# Patient Record
Sex: Female | Born: 1935 | Race: White | Hispanic: No | Marital: Single
Health system: Southern US, Community
[De-identification: ages and names within clinical notes are randomized; demographics above are authoritative.]

## PROBLEM LIST (undated history)

## (undated) DIAGNOSIS — I639 Cerebral infarction, unspecified: Secondary | ICD-10-CM

## (undated) DIAGNOSIS — F0390 Unspecified dementia without behavioral disturbance: Secondary | ICD-10-CM

---

## 2018-06-09 MED FILL — busPIRone HCL 10 MG TABS: 10 | 90 days supply | Qty: 90 | Fill #0

## 2018-06-09 MED FILL — AMLODIPINE 2.5 MG TABLET: 2.5 | 90 days supply | Qty: 90 | Fill #0

## 2018-09-17 ENCOUNTER — Emergency Department (HOSPITAL_COMMUNITY): Payer: Medicare Other

## 2018-09-17 ENCOUNTER — Encounter (HOSPITAL_COMMUNITY): Payer: Self-pay | Admitting: *Deleted

## 2018-09-17 ENCOUNTER — Other Ambulatory Visit: Payer: Self-pay

## 2018-09-17 ENCOUNTER — Emergency Department (HOSPITAL_COMMUNITY)
Admission: EM | Admit: 2018-09-17 | Discharge: 2018-09-17 | Disposition: A | Discharge status: Home / Self Care | Payer: Medicare Other | Attending: Emergency Medicine | Admitting: Emergency Medicine

## 2018-09-17 DIAGNOSIS — R112 Nausea with vomiting, unspecified: Secondary | ICD-10-CM | POA: Diagnosis not present

## 2018-09-17 DIAGNOSIS — F0390 Unspecified dementia without behavioral disturbance: Secondary | ICD-10-CM | POA: Diagnosis not present

## 2018-09-17 DIAGNOSIS — R55 Syncope and collapse: Secondary | ICD-10-CM | POA: Insufficient documentation

## 2018-09-17 HISTORY — DX: Unspecified dementia, unspecified severity, without behavioral disturbance, psychotic disturbance, mood disturbance, and anxiety: F03.90

## 2018-09-17 HISTORY — DX: Cerebral infarction, unspecified: I63.9

## 2018-09-17 LAB — CBG MONITORING, ED: Glucose-Capillary: 107 mg/dL — ABNORMAL HIGH (ref 70–99)

## 2018-09-17 LAB — BASIC METABOLIC PANEL
Anion gap: 5 (ref 5–15)
BUN: 25 mg/dL — ABNORMAL HIGH (ref 8–23)
CO2: 21 mmol/L — ABNORMAL LOW (ref 22–32)
Calcium: 8.2 mg/dL — ABNORMAL LOW (ref 8.9–10.3)
Chloride: 112 mmol/L — ABNORMAL HIGH (ref 98–111)
Creatinine, Ser: 0.96 mg/dL (ref 0.44–1.00)
GFR calc Af Amer: >60 mL/min (ref >60)
GFR calc non Af Amer: 55 mL/min — ABNORMAL LOW (ref >60)
Glucose, Bld: 113 mg/dL — ABNORMAL HIGH (ref 70–99)
Potassium: 5.2 mmol/L — ABNORMAL HIGH (ref 3.5–5.1)
Sodium: 138 mmol/L (ref 135–145)

## 2018-09-17 LAB — CBC
HCT: 39.9 % (ref 36.0–46.0)
Hemoglobin: 12.6 g/dL (ref 12.0–15.0)
MCH: 30.5 pg (ref 26.0–34.0)
MCHC: 31.6 g/dL (ref 30.0–36.0)
MCV: 96.6 fL (ref 80.0–100.0)
Platelets: UNDETERMINED 10*3/uL (ref 150–400)
RBC: 4.13 MIL/uL (ref 3.87–5.11)
RDW: 13.5 % (ref 11.5–15.5)
WBC: 3.8 10*3/uL — ABNORMAL LOW (ref 4.0–10.5)
nRBC: 0 % (ref 0.0–0.2)

## 2018-09-17 MED ORDER — SODIUM CHLORIDE 0.9% FLUSH
3.0000 mL | Freq: Once | INTRAVENOUS | Status: AC
  Administered 2018-09-17: 3 mL via INTRAVENOUS

## 2018-09-17 NOTE — Discharge Instructions (Addendum)
Be sure to follow-up with her cardiology colleagues tomorrow for appropriate ongoing care.  Please discuss Holter monitoring and cardiology follow-up.  Return here for concerning changes in your condition.

## 2018-09-17 NOTE — ED Provider Notes (Signed)
[facility] Provider Note   CSN: [id number] Arrival date & time: 09/17/18  1926     History   Chief Complaint Chief Complaint  Patient presents with  . Loss of Consciousness    HPI Jacqulyn Duckingeggy Morissette is a 85 y.o. female.     HPI Patient presents after episode of syncope. Patient has some dementia, but provides details reasonably well.  When she is accompanied by her daughter who corroborates and elaborates on the story. Daughter notes that the patient typically has episodes of near syncope and/or syncope each day after breakfast, recently. However, today, after dinner patient had an episode of unresponsiveness, nausea, vomiting x1 afterwards.  No fall, no trauma, and currently the patient has no complaints per No recent medication change, diet change, activity change. Patient has no history of known cardiac disease, has had a stroke in the past.  Past Medical History:  Diagnosis Date  . Dementia (HCC)   . Stroke [facility](HCC)      Home Medications    Prior to Admission medications   Not on File    Family History No family history on file.  Social History No cigarettes  Allergies   Patient has no known allergies.   Review of Systems Review of Systems  Constitutional:       Per HPI, otherwise negative  HENT:       Per HPI, otherwise negative  Respiratory:       Per HPI, otherwise negative  Cardiovascular:       Per HPI, otherwise negative  Gastrointestinal: Negative for vomiting.  Endocrine:       Negative aside from HPI  Genitourinary:       Neg aside from HPI   Musculoskeletal:       Per HPI, otherwise negative  Skin: Negative.   Neurological: Positive for syncope.  Psychiatric/Behavioral: Positive for confusion.     Physical Exam Updated Vital Signs BP 137/78   Pulse 82   Resp (!) 22   SpO2 99%   Physical Exam Vitals signs and nursing note reviewed.  Constitutional:      General: She is not in acute distress.    Appearance: She is well-developed.  HENT:     Head: Normocephalic and atraumatic.  Eyes:     Conjunctiva/sclera: Conjunctivae normal.  Cardiovascular:     Rate and Rhythm: Normal rate and regular rhythm.  Pulmonary:     Effort: Pulmonary effort is normal. No respiratory distress.     Breath sounds: Normal breath sounds. No stridor.  Abdominal:     General: There is no distension.  Skin:    General: Skin is warm and dry.  Neurological:     Mental Status: She is alert and oriented to person, place, and time.     Cranial Nerves: No cranial nerve deficit.  Psychiatric:        Mood and Affect: Mood normal.      ED Treatments / Results  Labs (all labs ordered are listed, but only abnormal results are displayed) Labs Reviewed  BASIC METABOLIC PANEL - Abnormal; Notable for the following components:      Result Value   Potassium 5.2 (*)    Chloride 112 (*)    CO2 21 (*)    Glucose, Bld 113 (*)    BUN 25 (*)    Calcium 8.2 (*)    GFR calc non Af Amer 55 (*)    All other components within normal limits  CBC -  Abnormal; Notable for the following components:   WBC 3.8 (*)    All other components within normal limits  CBG MONITORING, ED - Abnormal; Notable for the following components:   Glucose-Capillary 107 (*)    All other components within normal limits  CBG MONITORING, ED    EKG EKG Interpretation  Date/Time:  Wednesday September 17 2018 19:31:59 EDT Ventricular Rate:  75 PR Interval:    QRS Duration: 88 QT Interval:  391 QTC Calculation: 437 R Axis:   43 Text Interpretation:  Sinus rhythm ST-t wave abnormality Artifact Abnormal ECG Confirmed by Carmin Muskrat [phone]) on 09/17/2018 7:40:50 PM   Radiology Ct Head Wo Contrast  Result Date: 09/17/2018 CLINICAL DATA:  Recent syncopal episode EXAM: CT HEAD WITHOUT CONTRAST TECHNIQUE: Contiguous axial images were obtained from the base of the skull through the vertex without intravenous contrast. COMPARISON:  None. FINDINGS:  Brain: Mild atrophic and chronic white matter ischemic changes are seen. No findings to suggest acute hemorrhage, acute infarction or space-occupying mass lesion are noted. Vascular: No hyperdense vessel or unexpected calcification. Skull: Normal. Negative for fracture or focal lesion. Sinuses/Orbits: No acute finding. Other: None. IMPRESSION: Chronic changes without acute abnormality. Electronically Signed   By: Inez Catalina M.D.   On: 09/17/2018 21:17   Dg Chest Port 1 View  Result Date: 09/17/2018 CLINICAL DATA:  85 y/o  F; nausea and vomiting.  Syncope. EXAM: PORTABLE CHEST 1 VIEW COMPARISON:  None. FINDINGS: Normal cardiac silhouette. Aortic atherosclerosis with calcification. Clear lungs. No pleural effusion or pneumothorax. No acute osseous abnormality is evident. Surgical clips project over the right lower neck. IMPRESSION: No active disease. Electronically Signed   By: Kristine Garbe M.D.   On: 09/17/2018 20:18    Procedures Procedures (including critical care time)  Medications Ordered in ED Medications  sodium chloride flush (NS) 0.9 % injection 3 mL (3 mLs Intravenous Given 09/17/18 2040)     Initial Impression / Assessment and Plan / ED Course  I have reviewed the triage vital signs and the nursing notes.  Pertinent labs & imaging results that were available during my care of the patient were reviewed by me and considered in my medical decision making (see chart for details).  Patient in no distress on repeat exam.  When she urinated just prior to arrival, and after discussion with her daughter, they feel comfortable leaving prior to repeat evaluation. Patient has no leukocytosis, no fever, no dysuria, low suspicion for occult UTI. She does have mild hyperkalemia, mild elevated creatinine, both consistent with mild dehydration and has received fluid resuscitation. We discussed all findings at length, with no evidence for sustained arrhythmia, no evidence for new stroke, no  evidence for pneumonia, the patient will follow-up closely as an outpatient, both with cardiology for Holter monitoring and with primary care.   Final Clinical Impressions(s) / ED Diagnoses   Final diagnoses:  Syncope and collapse     Carmin Muskrat, MD 09/17/18 2229

## 2018-09-17 NOTE — ED Notes (Signed)
[phone] daughter Olivia Mackie

## 2018-09-17 NOTE — ED Triage Notes (Signed)
Pt was eating dinner  With daughter and while sitting at table Pt passed out per daughter. Pt had nausea and vomited x 1. Iv Lt FA18 g.. Pt has a hx dementia. EMS reported SBP 88 pal. The SBP 114/50

## 2018-09-17 NOTE — ED Notes (Signed)
Pt given ice water.

## 2018-10-01 ENCOUNTER — Telehealth: Payer: Self-pay

## 2018-10-01 NOTE — Telephone Encounter (Signed)
NOTES ON FILE FROM [facility] [phone] REFERRAL TO SCHEDULING

## 2018-10-02 ENCOUNTER — Telehealth: Payer: Self-pay | Admitting: *Deleted

## 2018-10-02 ENCOUNTER — Other Ambulatory Visit: Payer: Self-pay | Admitting: *Deleted

## 2018-10-02 DIAGNOSIS — R42 Dizziness and giddiness: Secondary | ICD-10-CM

## 2018-10-02 NOTE — Telephone Encounter (Signed)
Arrangements made to deliver ZIO XT patch monitor to patients daughter, Delfino Lovett. Traci will pick up ZIO XT monitor on 10/03/18 late afternoon at [address] [employer] at the [place] screening table.

## 2018-10-03 ENCOUNTER — Ambulatory Visit: Payer: Medicare Other

## 2018-10-03 ENCOUNTER — Encounter: Payer: Self-pay | Admitting: *Deleted

## 2018-10-03 ENCOUNTER — Other Ambulatory Visit: Payer: Self-pay | Admitting: *Deleted

## 2018-10-03 ENCOUNTER — Other Ambulatory Visit: Payer: Self-pay

## 2018-10-03 DIAGNOSIS — R42 Dizziness and giddiness: Secondary | ICD-10-CM

## 2018-10-03 NOTE — Progress Notes (Signed)
Patient ID: Bethany Cooper, female   DOB: [birth year], 85 y.o.   MRN: [id number] ZIO XT long term holter monitor will be left with covid screening staff in the lobby of [address]  Daughter, Vito Backers will pick monitor up Friday, 10/03/2018. She has been Clinical biochemist will only be available until 4:30-4:45 pm.  Telephone number for Leana Roe is [phone].

## 2018-10-04 ENCOUNTER — Ambulatory Visit (INDEPENDENT_AMBULATORY_CARE_PROVIDER_SITE_OTHER): Payer: Medicare Other

## 2018-10-04 DIAGNOSIS — R42 Dizziness and giddiness: Secondary | ICD-10-CM

## 2019-01-23 MED FILL — AMLODIPINE 2.5 MG TABLET: 2.5 | 90 days supply | Qty: 90 | Fill #0

## 2019-02-20 MED FILL — MECLIZINE 25 MG TABLET: 25 | 10 days supply | Qty: 30 | Fill #0

## 2019-02-24 MED FILL — MECLIZINE 25 MG TABLET: 25 | 10 days supply | Qty: 30 | Fill #0

## 2019-03-03 MED FILL — QUETIAPINE FUMARATE 50 MG T: 50 | 30 days supply | Qty: 30 | Fill #0

## 2019-04-17 ENCOUNTER — Ambulatory Visit: Payer: Medicare Other

## 2019-04-22 ENCOUNTER — Ambulatory Visit: Payer: Medicare Other

## 2019-04-27 ENCOUNTER — Ambulatory Visit: Payer: Medicare Other | Attending: Internal Medicine

## 2019-05-08 ENCOUNTER — Ambulatory Visit: Payer: Medicare Other

## 2019-05-10 ENCOUNTER — Ambulatory Visit: Payer: Medicare Other | Attending: Internal Medicine

## 2019-05-10 DIAGNOSIS — Z23 Encounter for immunization: Secondary | ICD-10-CM

## 2019-05-10 NOTE — Progress Notes (Signed)
   Covid-19 Vaccination Clinic  Name:  Bethany Cooper    MRN: [id number] DOB: 09-Jan-1933  05/10/2019  Bethany Cooper was observed post Covid-19 immunization for 15 minutes without incidence. She was provided with Vaccine Information Sheet and instruction to access the V-Safe system.   Bethany Cooper was instructed to call 911 with any severe reactions post vaccine: Marland Kitchen Difficulty breathing  . Swelling of your face and throat  . A fast heartbeat  . A bad rash all over your body  . Dizziness and weakness    Immunizations Administered    Name Date Dose VIS Date Route   Pfizer COVID-19 Vaccine 05/10/2019  8:36 AM 0.3 mL 02/27/2019 Intramuscular   Manufacturer: [employer], [employer]   Lot: [id number]   NDC: [address]-6

## 2019-05-11 ENCOUNTER — Ambulatory Visit: Payer: Medicare Other

## 2019-06-03 ENCOUNTER — Ambulatory Visit: Payer: Medicare Other | Attending: Internal Medicine

## 2019-06-03 DIAGNOSIS — Z23 Encounter for immunization: Secondary | ICD-10-CM

## 2019-06-03 NOTE — Progress Notes (Signed)
   Covid-19 Vaccination Clinic  Name:  Aubryana Vittorio    MRN: [id number] DOB: 07/12/1932  06/03/2019  Ms. Aguilar was observed post Covid-19 immunization for 15 minutes without incident. She was provided with Vaccine Information Sheet and instruction to access the V-Safe system.   Ms. Vosler was instructed to call 911 with any severe reactions post vaccine: Marland Kitchen Difficulty breathing  . Swelling of face and throat  . A fast heartbeat  . A bad rash all over body  . Dizziness and weakness   Immunizations Administered    Name Date Dose VIS Date Route   Pfizer COVID-19 Vaccine 06/03/2019  4:21 PM 0.3 mL 02/27/2019 Intramuscular   Manufacturer: [employer], [employer]   Lot: [id number]   NDC: [address]-0

## 2019-08-03 MED FILL — MIRTAZAPINE 15 MG TABLET: 15 | 30 days supply | Qty: 30 | Fill #0

## 2019-12-08 MED FILL — levoFLOXacin 250 MG TABS: 250 | 5 days supply | Qty: 6 | Fill #0

## 2020-07-19 ENCOUNTER — Telehealth: Payer: Self-pay

## 2020-07-19 NOTE — Telephone Encounter (Signed)
I connected by phone with Jacqulyn Ducking and/or patient's caregiver on 07/19/2020 at 9:24 AM to discuss the potential vaccination through our Homebound vaccination initiative.   Prevaccination Checklist for COVID-19 Vaccines  1.  Are you feeling sick today? no   2.  Have you ever received a dose of a COVID-19 vaccine?  yes      If yes, which one? Pfizer   How many dose of Covid-19 vaccine have your received and dates ? 3, 05/10/19, 06/03/19, 01/01/20   Check all that apply: I live in a long-term care setting. no  I have been diagnosed with a medical condition(s). Please list: _______________________ (pertinent to homebound status)  I am a first responder. no  I work in a long-term care facility, correctional facility, hospital, restaurant, retail setting, school, or other setting with high exposure to the public. no  4. Do you have a health condition or are you undergoing treatment that makes you moderately or severely immunocompromised? (This would include treatment for cancer or HIV, receipt of organ transplant, immunosuppressive therapy or high-dose corticosteroids, CAR-T-cell therapy, hematopoietic cell transplant [HCT], DiGeorge syndrome or Wiskott-Aldrich syndrome)  no  5. Have you received hematopoietic cell transplant (HCT) or CAR-T-cell therapies since receiving COVID-19 vaccine? no  6.  Have you ever had an allergic reaction: (This would include a severe reaction [ e.g., anaphylaxis] that required treatment with epinephrine or EpiPen or that caused you to go to the hospital.  It would also include an allergic reaction that occurred within 4 hours that caused hives, swelling, or respiratory distress, including wheezing.) A.  A previous dose of COVID-19 vaccine. no  B.  A vaccine or injectable therapy that contains multiple components, one of which is a COVID-19 vaccine component, but it is not known which component elicited the immediate reaction. no  C.  Are you allergic to polyethylene  glycol? no  D. Are you allergic to Polysorbate, which is found in some vaccines, film coated tablets and intravenous steroids?  no   7.  Have you ever had an allergic reaction to another vaccine (other than COVID-19 vaccine) or an injectable medication? (This would include a severe reaction [ e.g., anaphylaxis] that required treatment with epinephrine or EpiPen or that caused you to go to the hospital.  It would also include an allergic reaction that occurred within 4 hours that caused hives, swelling, or respiratory distress, including wheezing.)  no  8.  Have you ever had a severe allergic reaction (e.g., anaphylaxis) to something other than a component of the COVID-19 vaccine, or any vaccine or injectable medication?  This would include food, pet, venom, environmental, or oral medication allergies.  yes PCN  Check all that apply to you:  Am a female between ages 60 and 21 years old  no  Women 31 through 85 years of age can receive any FDA-authorized or -approved COVID-19 vaccine. However, they should be informed of the rare but increased risk of thrombosis with thrombocytopenia syndrome (TTS) after receipt of the [employer] Vaccine and the availability of other FDA-authorized and -approved COVID-19 vaccines. People who had TTS after a first dose of Janssen vaccine should not receive a subsequent dose of Janssen product    Am a female between ages 27 and 8 years old  no Males 5 through 85 years of age may receive the correct formulation of Pfizer-BioNTech COVID-19 vaccine. Males 18 and older can receive any FDA-authorized or -approved vaccine. However, people receiving an mRNA COVID-19 vaccine, especially males  12 through 85 years of age and their parents/legal representative (when relevant), should be informed of the risk of developing myocarditis (an inflammation of the heart muscle) or pericarditis (inflammation of the lining around the heart) after receipt of an mRNA vaccine. The risk of  developing either myocarditis or pericarditis after vaccination is low, and lower than the risk of myocarditis associated with SARS-CoV-2 infection in adolescents and adults. Vaccine recipients should be counseled about the need to seek care if symptoms of myocarditis or pericarditis develop after vaccination     Have a history of myocarditis or pericarditis  no Myocarditis or pericarditis after receipt of the first dose of an mRNA COVID-19 vaccine series but before administration of the second dose  Experts advise that people who develop myocarditis or pericarditis after a dose of an mRNA COVID-19 vaccine not receive a subsequent dose of any COVID-19 vaccine, until additional safety data are available.  Administration of a subsequent dose of COVID-19 vaccine before safety data are available can be considered in certain circumstances after the episode of myocarditis or pericarditis has completely resolved. Until additional data are available, some experts recommend a [employer] COVID-19 vaccine be considered instead of an mRNA COVID-19 vaccine. Decisions about proceeding with a subsequent dose should include a conversation between the patient, their parent/legal representative (when relevant), and their clinical team, which may include a cardiologist.    Have been treated with monoclonal antibodies or convalescent serum to prevent or treat COVID-19  no Vaccination should be offered to people regardless of history of prior symptomatic or asymptomatic SARS-CoV-2 infection. There is no recommended minimal interval between infection and vaccination.  However, vaccination should be deferred if a patient received monoclonal antibodies or convalescent serum as treatment for COVID-19 or for post-exposure prophylaxis. This is a precautionary measure until additional information becomes available, to avoid interference of the antibody treatment with vaccine-induced immune responses.  Defer COVID-19 vaccination for 30  days when a passive antibody product was used for post-exposure prophylaxis.  Defer COVID-19 vaccination for 90 days when a passive antibody product was used to treat COVID-19.     Diagnosed with Multisystem Inflammatory Syndrome (MIS-C or MIS-A) after a COVID-19 infection  no It is unknown if people with a history of MIS-C or MIS-A are at risk for a dysregulated immune response to COVID-19 vaccination.  People with a history of MIS-C or MIS-A may choose to be vaccinated. Considerations for vaccination may include:   Clinical recovery from MIS-C or MIS-A, including return to normal cardiac function   Personal risk of severe acute COVID-19 (e.g., age, underlying conditions)   High or substantial community transmission of SARS-CoV-2 and personal increased risk of reinfection.   Timing of any immunomodulatory therapies (general best practice guidelines for immunization can be consulted for more information [employer])   It has been 90 days or more since their diagnosis of MIS-C   Onset of MIS-C occurred before any COVID-19 vaccination   A conversation between the patient, their guardian(s), and their clinical team or a specialist may assist with COVID-19 vaccination decisions. Healthcare providers and health departments may also request a consultation from the Clinical Immunization Safety Assessment Project at [employer] vaccinesafety/ensuringsafety/monitoring/cisa/index.html.     Have a bleeding disorder  no Take a blood thinner  no As with all vaccines, any COVID-19 vaccine product may be given to these patients, if a physician familiar with the patient's bleeding risk determines that the vaccine can be administered intramuscularly with reasonable safety.  ACIP recommends  the following technique for intramuscular vaccination in patients with bleeding disorders or taking blood thinners: a fine-gauge needle (23-gauge or smaller caliber)  should be used for the vaccination, followed by firm pressure on the site, without rubbing, for at least 2 minutes.  People who regularly take aspirin or anticoagulants as part of their routine medications do not need to stop these medications prior to receipt of any COVID-19 vaccine.    Have a history of heparin-induced thrombocytopenia (HIT)  no Although the etiology of TTS associated with the [employer] COVID-19 vaccine is unclear, it appears to be similar to another rare immune-mediated syndrome, heparin-induced thrombocytopenia (HIT). People with a history of an episode of an immune-mediated syndrome characterized by thrombosis and thrombocytopenia, such as HIT, should be offered a currently FDA-approved or FDA-authorized mRNA COVID-19 vaccine if it has been ?90 days since their TTS resolved. After 90 days, patients may be vaccinated with any currently FDA-approved or FDA-authorized COVID-19 vaccine, including Janssen COVID-19 Vaccine. However, people who developed TTS after their initial Linwood Dibbles vaccine should not receive a Janssen booster dose.  Experts believe the following factors do not make people more susceptible to TTS after receipt of the [employer]. People with these conditions can be vaccinated with any FDA-authorized or - approved COVID-19 vaccine, including the [employer] COVID-19 Vaccine:   A prior history of venous thromboembolism   Risk factors for venous thromboembolism (e.g., inherited or acquired thrombophilia including Factor V Leiden; prothrombin gene 20210A mutation; antiphospholipid syndrome; protein C, protein S or antithrombin deficiency   A prior history of other types of thromboses not associated with thrombocytopenia   Pregnancy, post-partum status, or receipt of hormonal contraceptives (e.g., combined oral contraceptives, patch, ring)   Additional recipient education materials can be found at [employer] vaccines/safety/JJUpdate.html.     Am currently pregnant or breastfeeding  no Vaccination is recommended for all people aged 74 years and older, including people that are:   Pregnant   Breastfeeding   Trying to get pregnant now or who might become pregnant in the future   Pregnant, breastfeeding, and post-partum people 49 through 85 years of age should be aware of the rare risk of TTS after receipt of the [employer] COVID-19 Vaccine and the availability of other FDA-authorized or -approved COVID-19 vaccines (i.e., mRNA vaccines).    Have received dermal fillers  no FDA-authorized or -approved COVID-19 vaccines can be administered to people who have received injectable dermal fillers who have no contraindications for vaccination.  Infrequently, these people might experience temporary swelling at or near the site of filler injection (usually the face or lips) following administration of a dose of an mRNA COVID-19 vaccine. These people should be advised to contact their healthcare provider if swelling develops at or near the site of dermal filler following vaccination.     Have a history of Guillain-Barr Syndrome (GBS)  no People with a history of GBS can receive any FDA-authorized or -approved COVID-19 vaccine. However, given the possible association between the [employer] and an increased risk of GBS, a patient with a history of GBS and their clinical team should discuss the availability of mRNA vaccines to offer protection against COVID-19. The highest risk has been observed in men aged 50-64 years with symptoms of GBS beginning within 42 days after [employer] COVID-19 vaccination.  People who had GBS after receiving Janssen vaccine should be made aware of the option to receive an mRNA COVID-19 vaccine booster at least 2 months (8 weeks) after the [employer]  dose. However, Linwood DibblesJanssen vaccine may be used as a booster, particularly if GBS occurred more than 42 days after vaccination or was related to a non-vaccine factor. Prior to  booster vaccination, a conversation between the patient and their clinical team may assist with decisions about use of a COVID-19 booster dose, including the timing of administration     Postvaccination Observation Times for People without Contraindications to Covid 19 Vaccination.  30 minutes:  People with a history of: A contraindication to another type of COVID-19 vaccine product (i.e., mRNA or viral vector COVID-19 vaccines)   Immediate (within 4 hours of exposure) non-severe allergic reaction to a COVID-19 vaccine or injectable therapies   Anaphylaxis due to any cause   Immediate allergic reaction of any severity to a non-COVID-19 vaccine   15 minutes: All other people  This patient is a 87 y.o. female that meets the FDA criteria to receive homebound vaccination. Patient or parent/caregiver understands they have the option to accept or refuse homebound vaccination.  Patient passed the pre-screening checklist and would like to proceed with homebound vaccination.  Based on questionnaire above, I recommend the patient be observed for 15 minutes.  There are an estimated #0 other household members/caregivers who are also interested in receiving the vaccine.    The patient has been confirmed homebound and eligible for homebound vaccination with the considerations outlined above. I will send the patient's information to our scheduling team who will reach out to schedule the patient and potential caregiver/family members for homebound vaccination.    Skip MayerDeborah G Morocco Gipe 07/19/2020 9:24 AM

## 2020-07-28 ENCOUNTER — Ambulatory Visit: Payer: Medicare (Managed Care) | Attending: Critical Care Medicine

## 2020-07-28 DIAGNOSIS — Z23 Encounter for immunization: Secondary | ICD-10-CM

## 2020-07-28 NOTE — Progress Notes (Signed)
   Covid-19 Vaccination Clinic  Name:  Bethany Cooper    MRN: [id number] DOB: 1933/03/07  07/28/2020  Bethany Cooper was observed post Covid-19 immunization for 15 minutes without incident. She was provided with Vaccine Information Sheet and instruction to access the V-Safe system.   Bethany Cooper was instructed to call 911 with any severe reactions post vaccine: Marland Kitchen Difficulty breathing  . Swelling of face and throat  . A fast heartbeat  . A bad rash all over body  . Dizziness and weakness   Immunizations Administered    Name Date Dose VIS Date Route   PFIZER Comrnaty(Gray TOP) Covid-19 Vaccine 07/28/2020 10:37 AM 0.3 mL 02/25/2020 Intramuscular   Manufacturer: [employer], [employer]   Lot: [id number]   NDC: [id number]

## 2020-09-16 DEATH — deceased

## 2021-01-31 IMAGING — CT CT HEAD WITHOUT CONTRAST
3 series · 16 of 47 positions shown, 19 images · non-contrast
Comparison: None.

CLINICAL DATA: Recent syncopal episode

EXAM:
CT HEAD WITHOUT CONTRAST
TECHNIQUE: Contiguous axial images were obtained from the base of the skull
through the vertex without intravenous contrast.

[Series 3: head 5.0 h30s · axial · 0.40mm/px · z∈[-104,+31]mm · 10 of 33 slices shown, 13 images]
[im 3/33  brain]
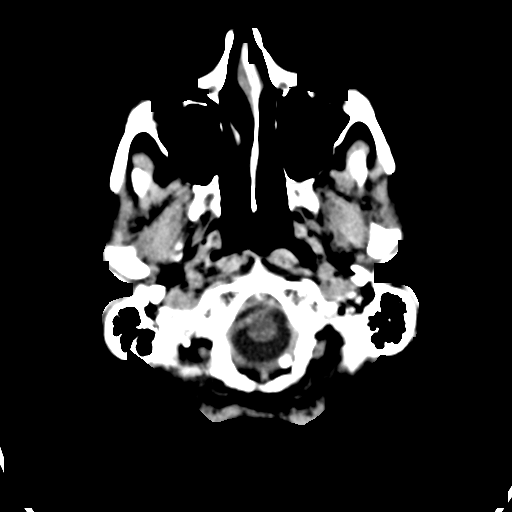
[im 3/33  bone]
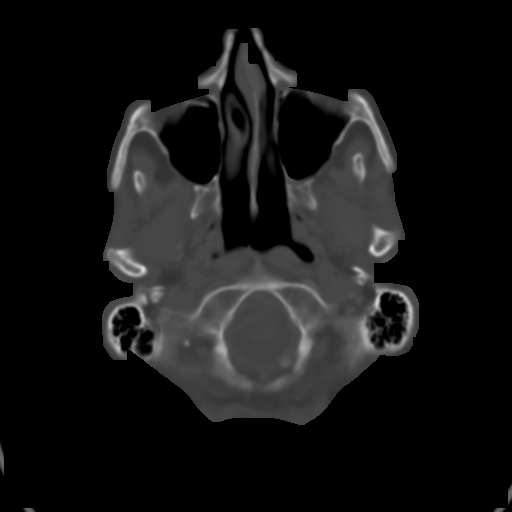
[im 6/33  brain]
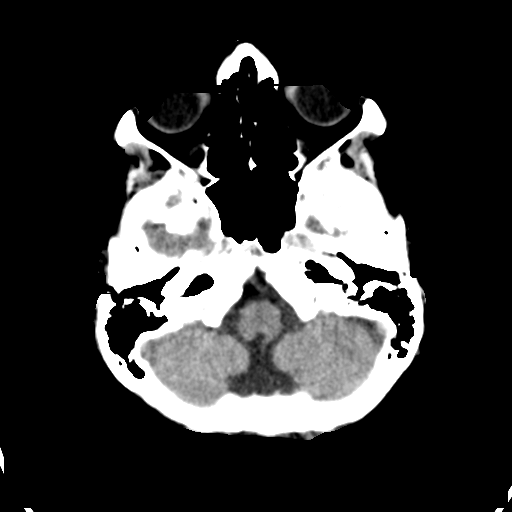
[im 9/33  brain]
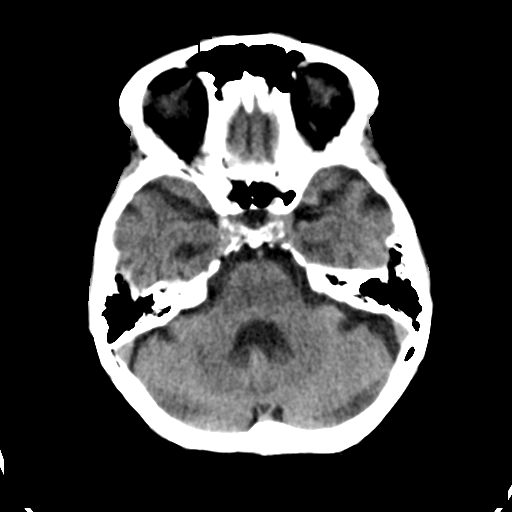
[im 12/33  brain]
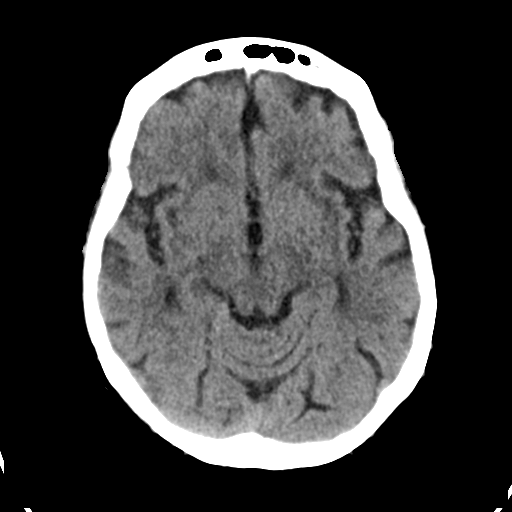
[im 15/33  brain]
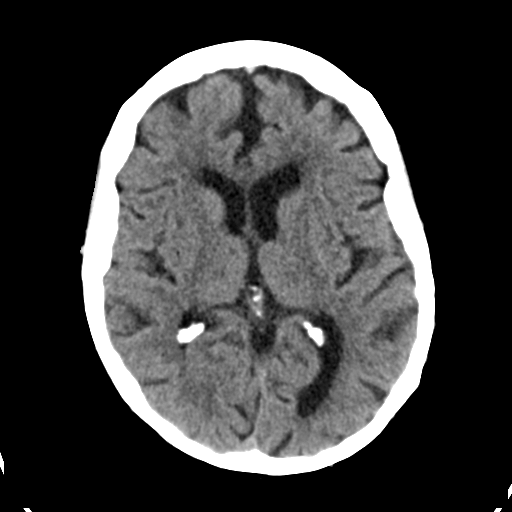
[im 15/33  bone]
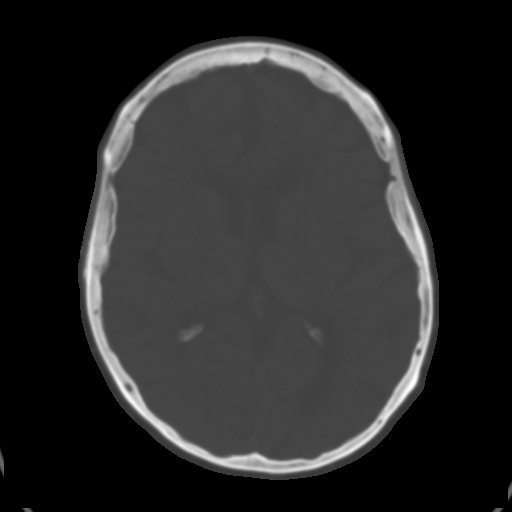
[im 18/33  brain]
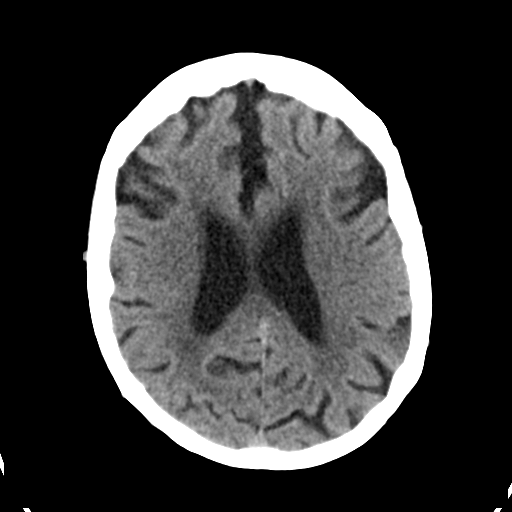
[im 21/33  brain]
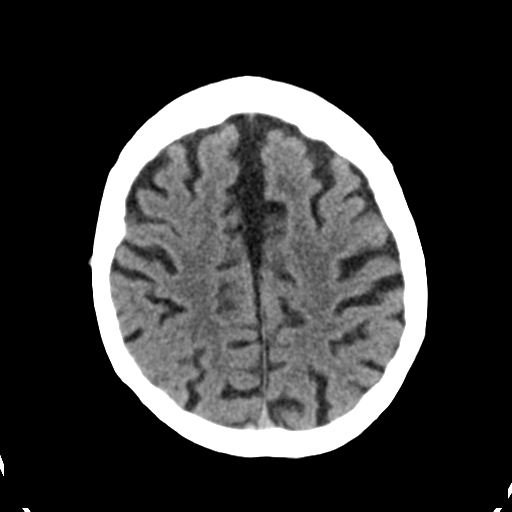
[im 25/33  brain]
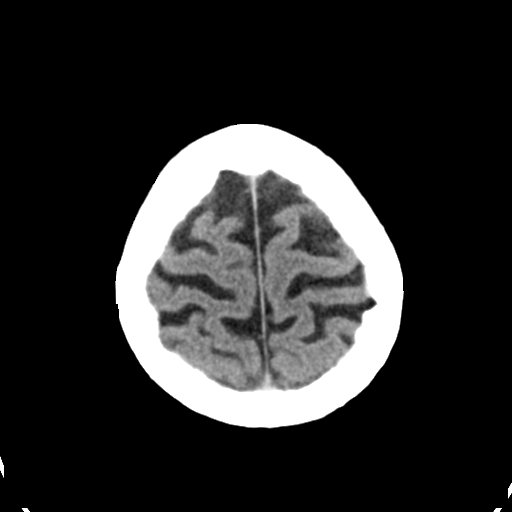
[im 27/33  brain]
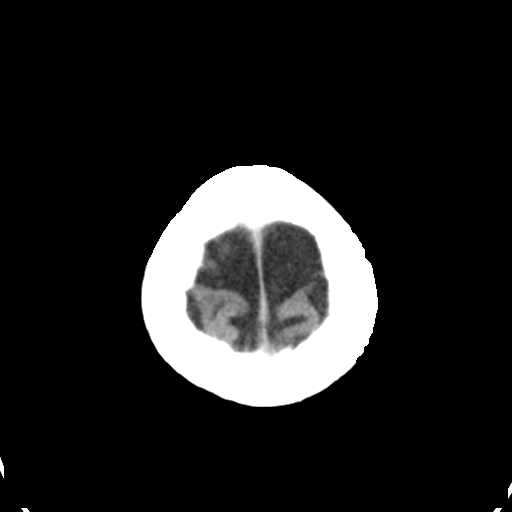
[im 27/33  bone]
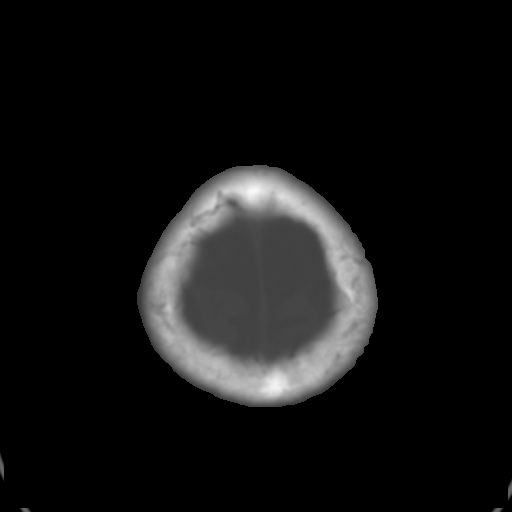
[im 30/33  brain]
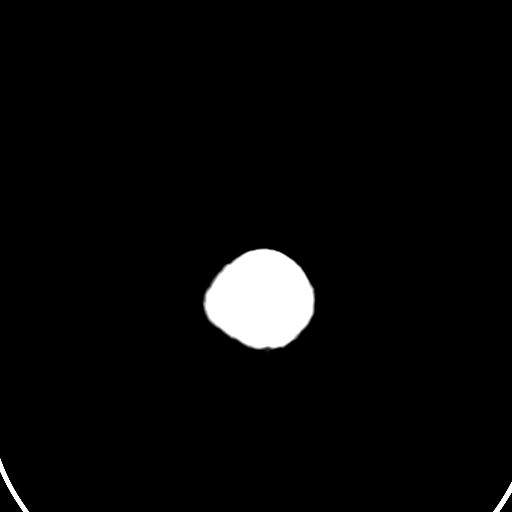

[Series 5: head 3.0 mpr cor · coronal · 0.31mm/px · 3 of 67 slices shown]
[im 23/67  brain]
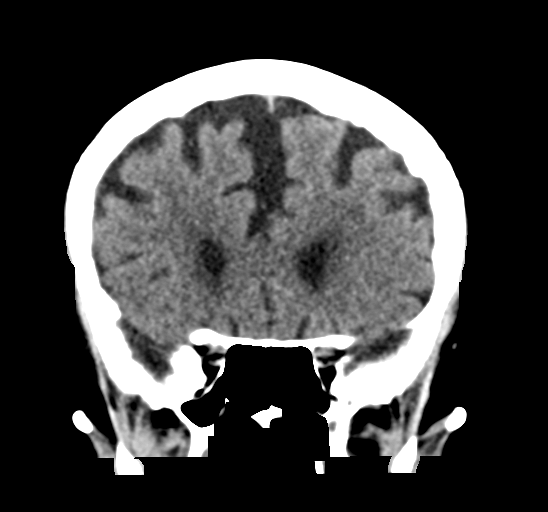
[im 30/67  brain]
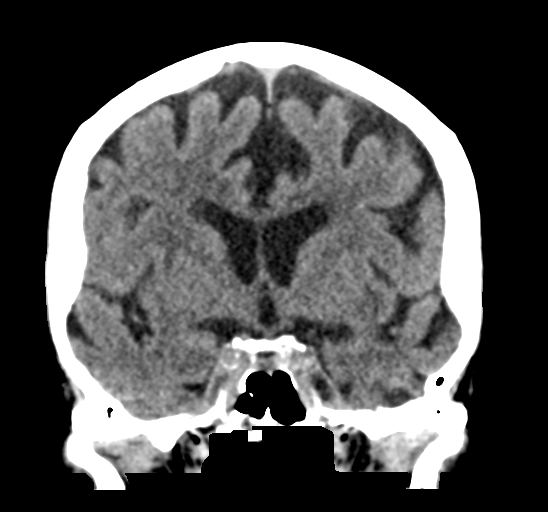
[im 37/67  brain]
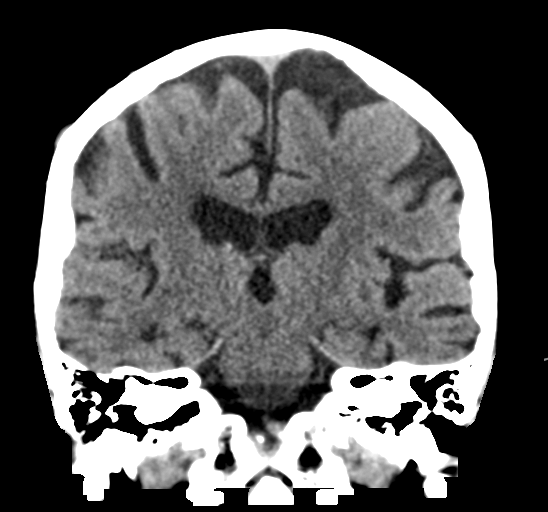

[Series 6: head 3.0 mpr sag · sagittal · 0.32mm/px · 3 of 57 slices shown]
[im 19/57  brain]
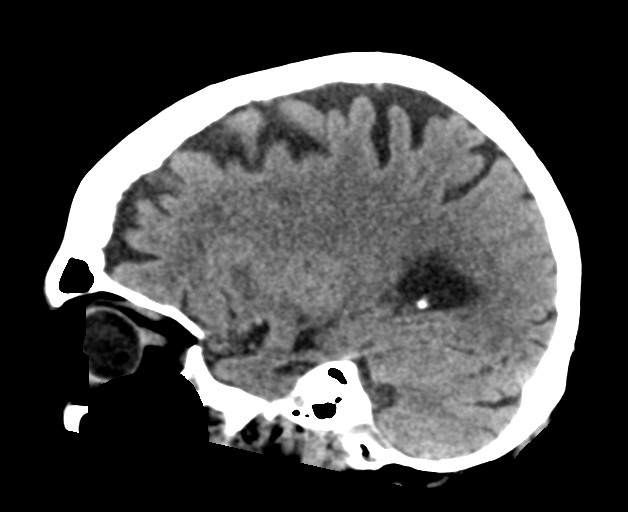
[im 29/57  brain]
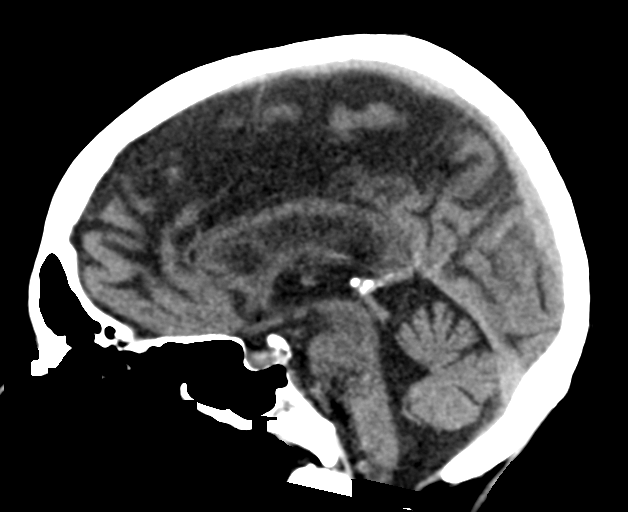
[im 38/57  brain]
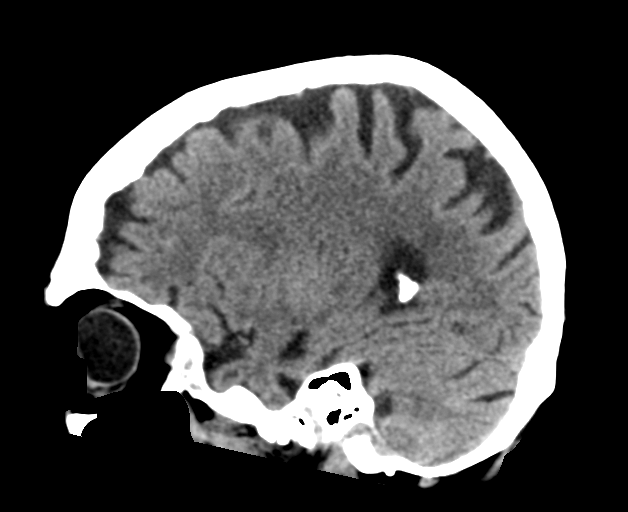

[16 of 47 positions shown; findings below may reference images not displayed]

FINDINGS: Brain: Mild atrophic and chronic white matter ischemic changes are
seen. No findings to suggest acute hemorrhage, acute infarction or
space-occupying mass lesion are noted.

Vascular: No hyperdense vessel or unexpected calcification.

Skull: Normal. Negative for fracture or focal lesion.

Sinuses/Orbits: No acute finding.

Other: None.
IMPRESSION: Chronic changes without acute abnormality.
